# Patient Record
Sex: Female | Born: 2000 | Race: Black or African American | Hispanic: No | Marital: Single | Smoking: Never smoker
Health system: Southern US, Community
[De-identification: ages and names within clinical notes are randomized; demographics above are authoritative.]

## PROBLEM LIST (undated history)

## (undated) DIAGNOSIS — S060X9A Concussion with loss of consciousness of unspecified duration, initial encounter: Secondary | ICD-10-CM

## (undated) DIAGNOSIS — F410 Panic disorder [episodic paroxysmal anxiety] without agoraphobia: Secondary | ICD-10-CM

## (undated) DIAGNOSIS — T7840XA Allergy, unspecified, initial encounter: Secondary | ICD-10-CM

## (undated) DIAGNOSIS — G43109 Migraine with aura, not intractable, without status migrainosus: Secondary | ICD-10-CM

## (undated) HISTORY — PX: TONSILLECTOMY: SUR1361

## (undated) HISTORY — DX: Allergy, unspecified, initial encounter: T78.40XA

## (undated) HISTORY — DX: Concussion with loss of consciousness of unspecified duration, initial encounter: S06.0X9A

---
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
## 2019-05-07 ENCOUNTER — Encounter: Payer: Medicaid Other | Admitting: Family Medicine

## 2019-05-09 ENCOUNTER — Ambulatory Visit (INDEPENDENT_AMBULATORY_CARE_PROVIDER_SITE_OTHER): Payer: Medicaid Other | Admitting: Family Medicine

## 2019-05-09 ENCOUNTER — Encounter: Payer: Self-pay | Admitting: Family Medicine

## 2019-05-09 ENCOUNTER — Other Ambulatory Visit: Payer: Self-pay

## 2019-05-09 VITALS — BP 108/60 | HR 82 | Wt 265.8 lb

## 2019-05-09 DIAGNOSIS — Z00129 Encounter for routine child health examination without abnormal findings: Secondary | ICD-10-CM

## 2019-05-09 DIAGNOSIS — Z30011 Encounter for initial prescription of contraceptive pills: Secondary | ICD-10-CM

## 2019-05-09 LAB — POCT URINE PREGNANCY: Preg Test, Ur: NEGATIVE

## 2019-05-09 MED ORDER — LEVONORGEST-ETH ESTRAD 91-DAY 0.15-0.03 MG PO TABS: 1.0000 | ORAL_TABLET | Freq: Every day | ORAL | 4 refills | Status: DC
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
## 2019-05-09 NOTE — Patient Instructions (Addendum)
It was nice meeting you today Kathleen Thornton!  If you have any questions or concerns, please feel free to call the clinic.   Be well,  Dr. Shan Levans

## 2019-05-10 ENCOUNTER — Encounter: Payer: Self-pay | Admitting: Family Medicine

## 2019-05-10 DIAGNOSIS — Z30011 Encounter for initial prescription of contraceptive pills: Secondary | ICD-10-CM | POA: Insufficient documentation

## 2019-05-10 NOTE — Assessment & Plan Note (Signed)
Urine pregnancy test is negative today.  Patient was counseled on the proper use of oral contraceptives, which means that she takes them at around the same time every day.  She was told that if she skips even 2 pills in a row, she could become pregnant.  She was counseled that having a period is not biologically necessary when on contraception.  Patient wished to proceed with oral contraception and would like to have fewer periods, so generic Setlakin was prescribed.  She was encouraged to call if she has any issues tolerating or obtaining this medication.

## 2019-05-10 NOTE — Assessment & Plan Note (Addendum)
Patient is up-to-date on vaccinations.  HIV testing was offered to patient, but she declined this.  Healthy eating and exercise was discussed with the patient.  She was also counseled on using condoms to prevent STIs.  She was congratulated on starting college and her plans to become a Marine scientist.

## 2019-05-17 ENCOUNTER — Telehealth: Payer: Self-pay

## 2019-05-17 NOTE — Telephone Encounter (Signed)
Patient calls nurse line stating she picked up her birth control pills, however there was only one pill. I asked patient if she meant 1 pill pack? Patient stated, "no just one pill like plan B." I called pharmacy to see what the patient picked up. The pharmacist stated upon review that it was their error and they dispenses only one tablet to her. The pharmacist stated she will call the patient to explain and clarify would has happened. The pharmacist will dispense the correct pill pack to patient.

## 2019-07-04 ENCOUNTER — Other Ambulatory Visit: Payer: Self-pay

## 2019-07-04 ENCOUNTER — Ambulatory Visit (INDEPENDENT_AMBULATORY_CARE_PROVIDER_SITE_OTHER): Payer: Medicaid Other | Admitting: Family Medicine

## 2019-07-04 VITALS — BP 110/70 | HR 74 | Wt 272.8 lb

## 2019-07-04 DIAGNOSIS — R300 Dysuria: Secondary | ICD-10-CM

## 2019-07-04 DIAGNOSIS — N3000 Acute cystitis without hematuria: Secondary | ICD-10-CM

## 2019-07-04 LAB — POCT URINALYSIS DIP (MANUAL ENTRY)
Bilirubin, UA: NEGATIVE
Blood, UA: NEGATIVE
Glucose, UA: NEGATIVE mg/dL
Ketones, POC UA: NEGATIVE mg/dL
Leukocytes, UA: NEGATIVE
Nitrite, UA: NEGATIVE
Protein Ur, POC: NEGATIVE mg/dL
Spec Grav, UA: 1.025 (ref 1.010–1.025)
Urobilinogen, UA: 1 E.U./dL
pH, UA: 6.5 (ref 5.0–8.0)

## 2019-07-04 MED ORDER — CEPHALEXIN 500 MG PO CAPS: 500.0000 mg | ORAL_CAPSULE | Freq: Four times a day (QID) | ORAL | 0 refills | Status: AC

## 2019-07-04 NOTE — Progress Notes (Signed)
     Subjective:  HPI: Kathleen Thornton is a 18 y.o. presenting to clinic today to discuss the following:  Burning on Urination Patient reports dysuria that has been ongoing for one week. She also has associated frequency and urgency. She also endorses a little bit of blood after wiping after urination. She states she had this before and was treated for a UTI with antibiotics and got better. She denies fever, chills, flank pain, nausea, vomiting, abdomina pain, or constipation.  ROS noted in HPI.    Social History   Tobacco Use  Smoking Status Never Smoker  Smokeless Tobacco Never Used    Objective: BP 110/70   Pulse 74   Wt 272 lb 12.8 oz (123.7 kg)   SpO2 99%  Vitals and nursing notes reviewed  Physical Exam Gen: Alert and Oriented x 3, NAD CV: RRR, no murmurs, normal S1, S2 split Resp: CTAB, no wheezing, rales, or rhonchi, comfortable work of breathing Ext: no clubbing, cyanosis, or edema Skin: warm, dry, intact, no rashes  Results for orders placed or performed in visit on 07/04/19 (from the past 72 hour(s))  POCT urinalysis dipstick     Status: None   Collection Time: 07/04/19  3:45 PM  Result Value Ref Range   Color, UA yellow yellow   Clarity, UA clear clear   Glucose, UA negative negative mg/dL   Bilirubin, UA negative negative   Ketones, POC UA negative negative mg/dL   Spec Grav, UA 1.025 1.010 - 1.025   Blood, UA negative negative   pH, UA 6.5 5.0 - 8.0   Protein Ur, POC negative negative mg/dL   Urobilinogen, UA 1.0 0.2 or 1.0 E.U./dL   Nitrite, UA Negative Negative   Leukocytes, UA Negative Negative    Assessment/Plan:  UTI (urinary tract infection) Patient had negative UA and no blood on UA however given her constellation of symptoms I will treat for UTI. - Keflex 500mg  QID for 7 days - F/u if no improvement   PATIENT EDUCATION PROVIDED: See AVS    Diagnosis and plan along with any newly prescribed medication(s) were discussed in detail with  this patient today. The patient verbalized understanding and agreed with the plan. Patient advised if symptoms worsen return to clinic or ER.     Orders Placed This Encounter  Procedures  . POCT urinalysis dipstick    Meds ordered this encounter  Medications  . cephALEXin (KEFLEX) 500 MG capsule    Sig: Take 1 capsule (500 mg total) by mouth 4 (four) times daily for 7 days.    Dispense:  28 capsule    Refill:  0     Harolyn Rutherford, DO 07/04/2019, 3:55 PM PGY-3 [place]

## 2019-07-04 NOTE — Patient Instructions (Signed)
Acute Urinary Retention, Female  Acute urinary retention means that you cannot pee (urinate) at all, or that you pee too little and your bladder is not emptied completely. If it is not treated, it can lead to kidney damage or other serious problems. Follow these instructions at home:  Take over-the-counter and prescription medicines only as told by your doctor. Ask your doctor what medicines you should stay away from. Do not take any medicine unless your doctor says it is okay to do so.  If you were sent home with a tube that drains pee from the bladder (catheter), take care of it as told by your doctor.  Drink enough fluid to keep your pee clear or pale yellow.  If you were given an antibiotic, take it as told by your doctor. Do not stop taking the antibiotic even if you start to feel better.  Do not use any products that contain nicotine or tobacco, such as cigarettes and e-cigarettes. If you need help quitting, ask your doctor.  Watch for changes in your symptoms. Tell your doctor about them.  If told, keep track of any changes in your blood pressure at home. Tell your doctor about them.  Keep all follow-up visits as told by your doctor. This is important. Contact a doctor if:  You have spasms or you leak pee when you have spasms. Get help right away if:  You have chills or a fever.  You have blood in your pee.  You have a tube that drains the bladder and: ? The tube stops draining pee. ? The tube falls out. Summary  Acute urinary retention means that you cannot pee at all, or that you pee too little and your bladder is not emptied completely. If it is not treated, it can result in kidney damage or other serious problems.  If you were sent home with a tube that drains pee from the bladder, take care of it as told by your doctor.  Pay attention to any changes in your symptoms. Tell your doctor about them. This information is not intended to replace advice given to you by your  health care provider. Make sure you discuss any questions you have with your health care provider. Document Released: 03/01/2008 Document Revised: 08/26/2017 Document Reviewed: 10/15/2016 Elsevier Patient Education  2020 Elsevier Inc.  

## 2019-07-09 ENCOUNTER — Encounter: Payer: Self-pay | Admitting: Family Medicine

## 2019-07-09 DIAGNOSIS — N390 Urinary tract infection, site not specified: Secondary | ICD-10-CM | POA: Insufficient documentation

## 2019-07-09 NOTE — Assessment & Plan Note (Signed)
Patient had negative UA and no blood on UA however given her constellation of symptoms I will treat for UTI. - Keflex 500mg  QID for 7 days - F/u if no improvement

## 2019-07-20 ENCOUNTER — Other Ambulatory Visit: Payer: Self-pay

## 2019-07-20 DIAGNOSIS — Z20822 Contact with and (suspected) exposure to covid-19: Secondary | ICD-10-CM

## 2019-07-21 LAB — NOVEL CORONAVIRUS, NAA: SARS-CoV-2, NAA: NOT DETECTED

## 2019-07-23 ENCOUNTER — Telehealth: Payer: Self-pay | Admitting: General Practice

## 2019-07-23 NOTE — Telephone Encounter (Signed)
Patient informed of negative covid result. Patient verbalized understanding.   

## 2019-07-25 ENCOUNTER — Ambulatory Visit: Payer: Medicaid Other | Admitting: Family Medicine

## 2019-08-07 ENCOUNTER — Other Ambulatory Visit: Payer: Self-pay

## 2019-08-07 DIAGNOSIS — Z20822 Contact with and (suspected) exposure to covid-19: Secondary | ICD-10-CM

## 2019-08-09 LAB — NOVEL CORONAVIRUS, NAA: SARS-CoV-2, NAA: NOT DETECTED

## 2019-08-27 ENCOUNTER — Other Ambulatory Visit: Payer: Self-pay

## 2019-08-28 MED ORDER — LEVONORGEST-ETH ESTRAD 91-DAY 0.15-0.03 MG PO TABS: 1.0000 | ORAL_TABLET | Freq: Every day | ORAL | 4 refills | Status: AC

## 2019-12-26 IMAGING — DX DG CHEST 2V
2 series · 2 of 2 positions shown · non-contrast
Comparison: Radiographs October 12, 2016.

CLINICAL DATA: Chest pain.

EXAM:
CHEST  2 VIEW

[chest pa]
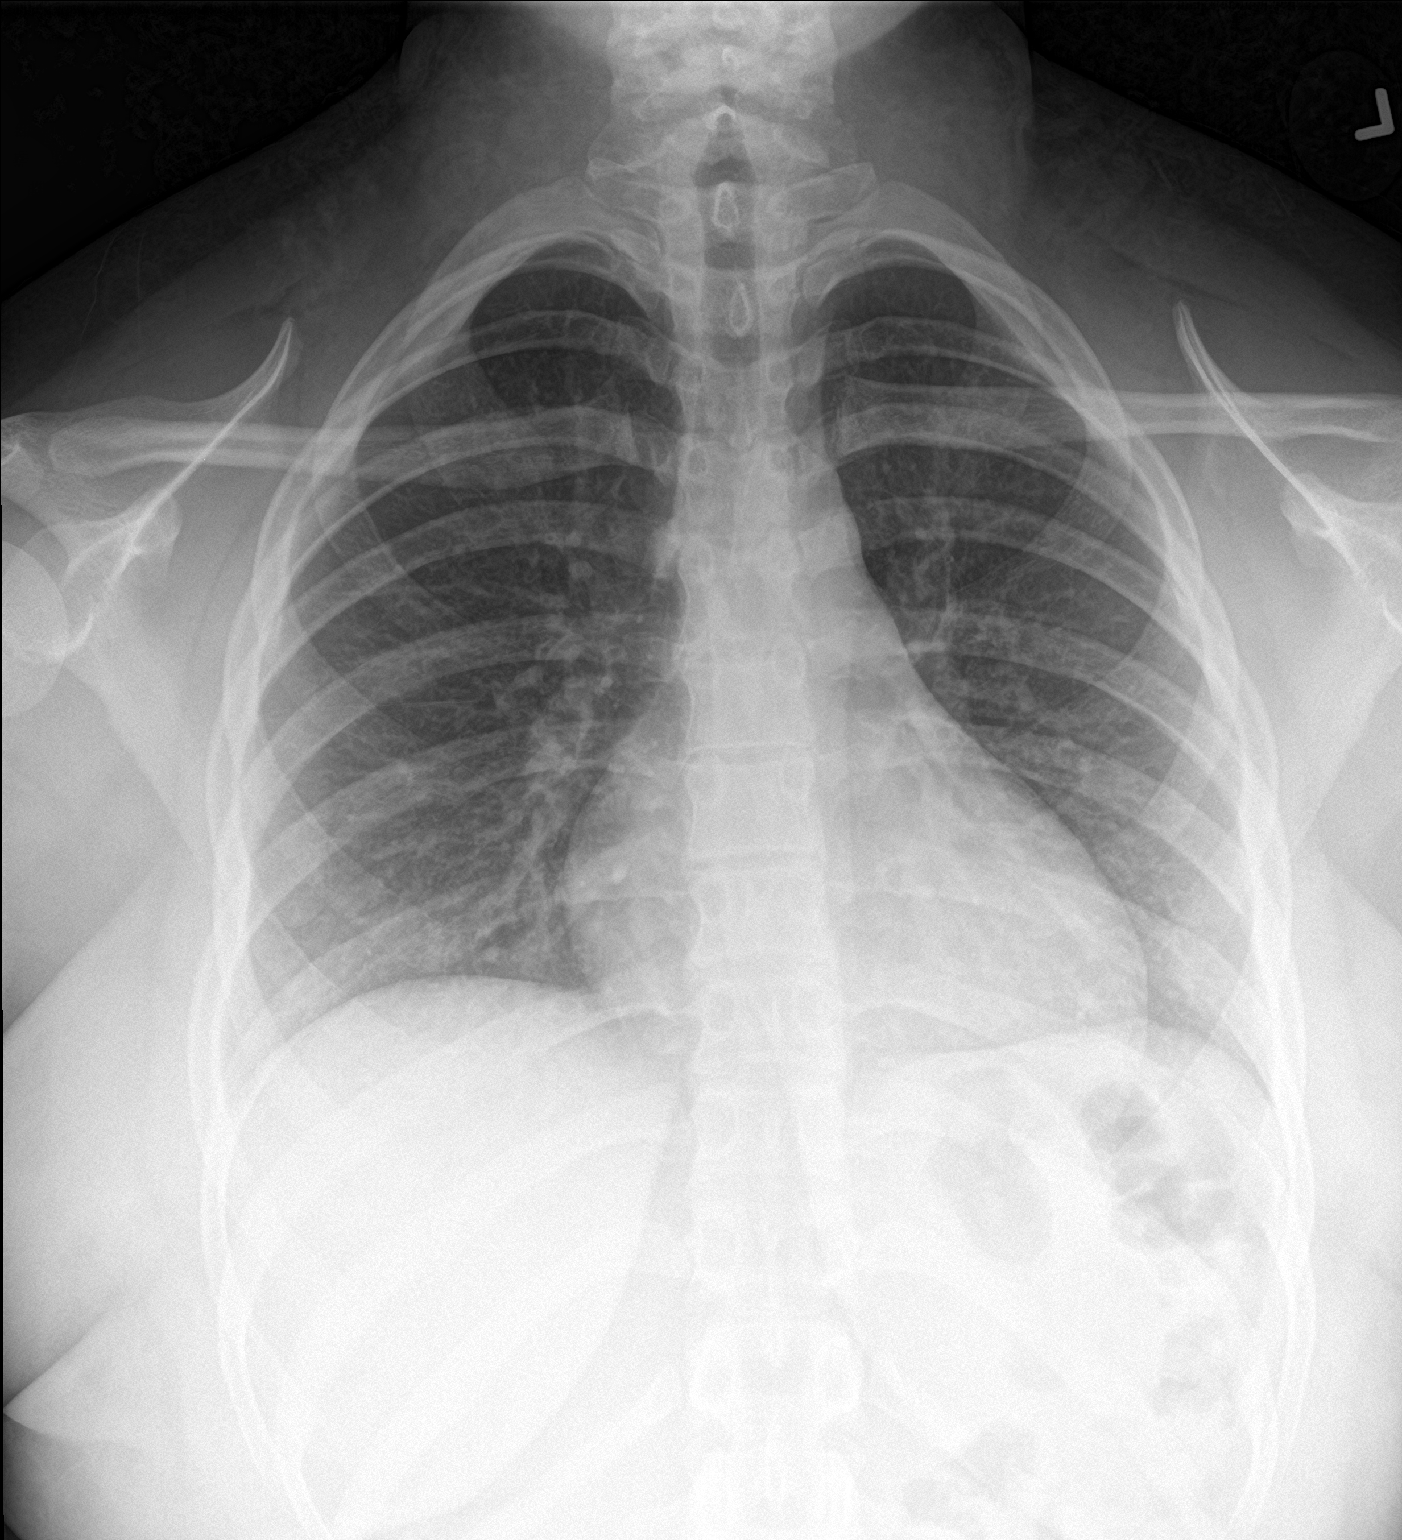

[chest lat]
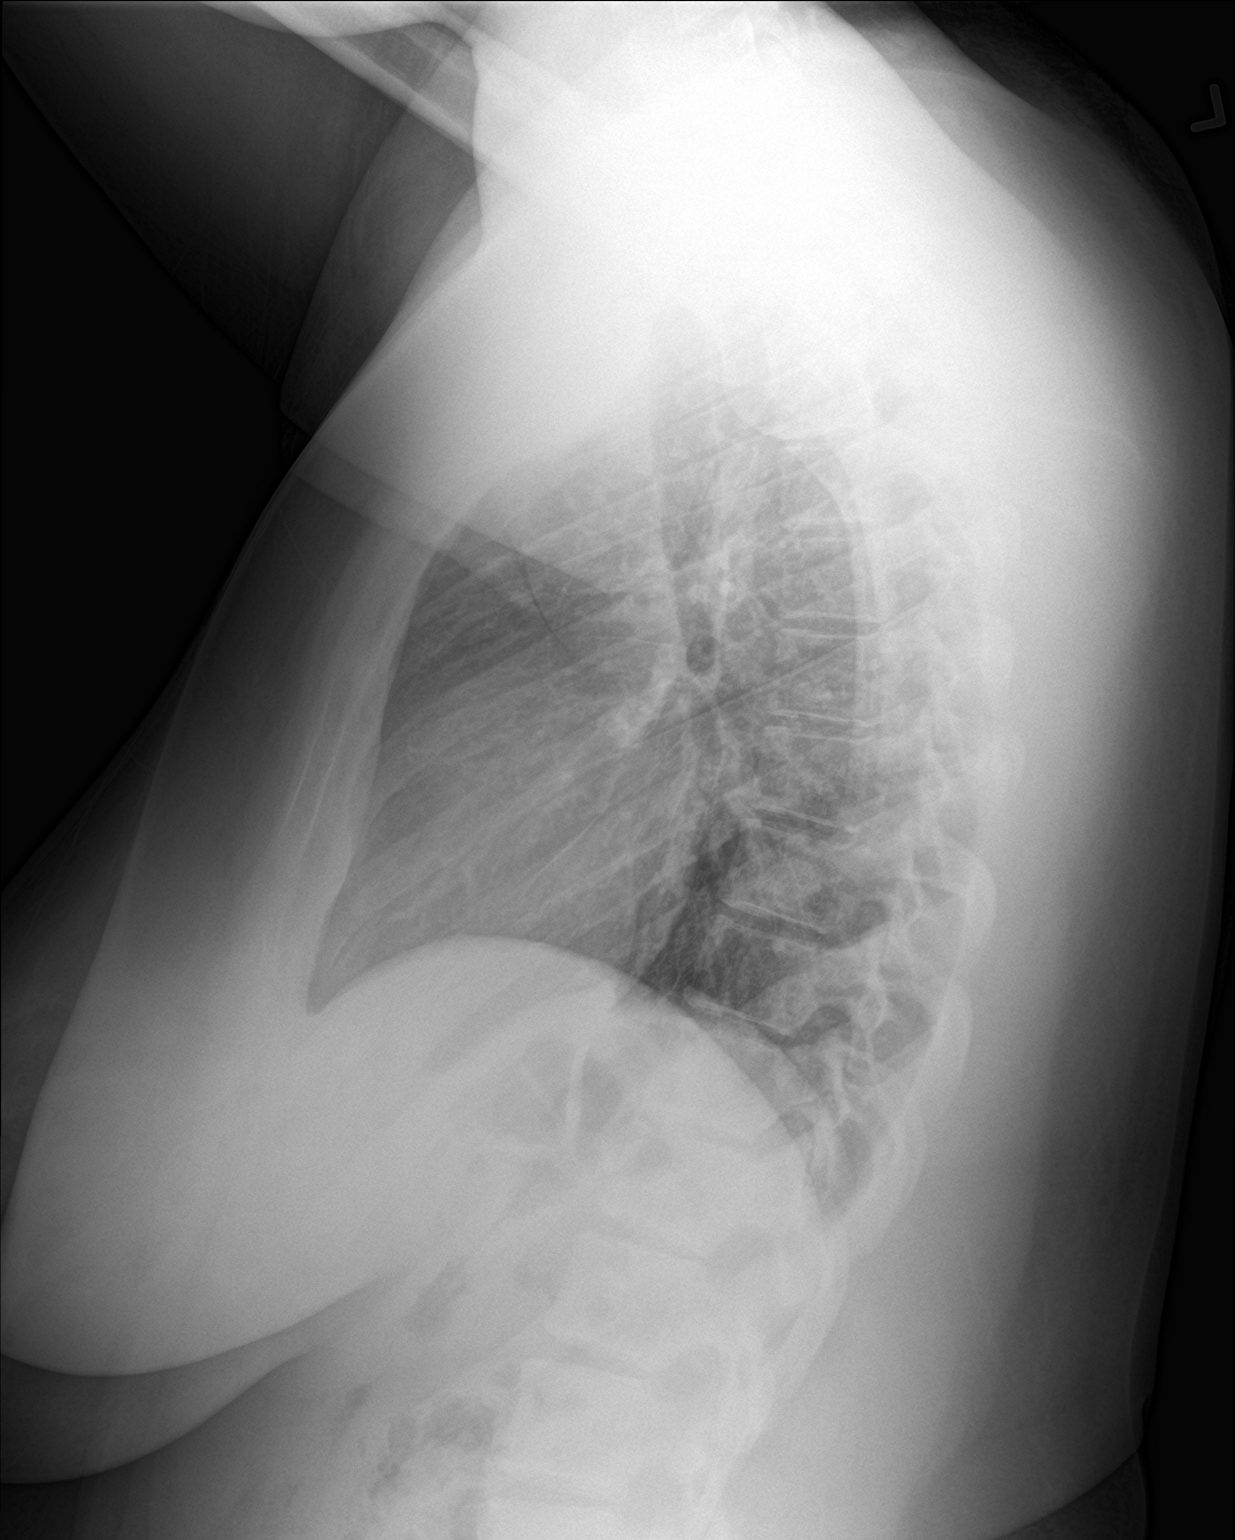

[2 of 2 positions shown; findings below may reference images not displayed]

FINDINGS: The heart size and mediastinal contours are within normal limits.
Both lungs are clear. No pneumothorax or pleural effusion is noted.
The visualized skeletal structures are unremarkable.
IMPRESSION: No active cardiopulmonary disease.

## 2020-02-07 ENCOUNTER — Ambulatory Visit (INDEPENDENT_AMBULATORY_CARE_PROVIDER_SITE_OTHER): Payer: Medicaid Other | Admitting: Family Medicine

## 2020-02-07 ENCOUNTER — Other Ambulatory Visit: Payer: Self-pay

## 2020-02-07 VITALS — BP 122/64 | HR 82 | Ht 66.0 in | Wt 288.4 lb

## 2020-02-07 DIAGNOSIS — R4589 Other symptoms and signs involving emotional state: Secondary | ICD-10-CM | POA: Insufficient documentation

## 2020-02-07 MED ORDER — SERTRALINE HCL 25 MG PO TABS: 25.0000 mg | ORAL_TABLET | Freq: Every day | ORAL | 1 refills | Status: AC

## 2020-02-07 NOTE — Progress Notes (Signed)
    SUBJECTIVE:   CHIEF COMPLAINT / HPI:   Mood Concerns  Few months has been getting annoyed easily, grouchy, feeling tired, has been wanting to overeat In the last few months has been living with boyfriend and his family which is stressful Taking time off school because it got too stressful Feels like she is sleeping too much She is working as a Ambulance person She used to work out but she has been too tired and just wants to sleep Her boyfriend has noticed that all she wants to do is sleep Denies SI     Office Visit from 02/07/2020 in [facility]  PHQ-9 Total Score  14      PERTINENT  PMH / PSH: None  OBJECTIVE:   BP 122/64   Pulse 82   Ht 5\' 6"  (1.676 m)   Wt 288 lb 6.4 oz (130.8 kg)   LMP 01/23/2020 (Approximate)   SpO2 96%   BMI 46.55 kg/m   Physical Exam:  General: 19 y.o. female in NAD Lungs: Breathing comfortably on RA Skin: warm and dry   ASSESSMENT/PLAN:   Depressed mood No SI.  Suspect Depression contributing to patient's symptoms with overall stress.  Discussed treatment options with patient including therapy and medication.  Patient has previously tried therapy and stated that she felt like she did not want to talk with the therapist because she thought she was unloading her stress on her.  States that she would be open to trying medication.  We will start her at a very small dose, Zoloft 25 mg daily.  Advised patient that this will take 6 weeks to take maximal effect.  Can consider increasing in 2 to 3 weeks when she follows up as long as she tolerates this well.  Also encourage patient to explore other therapy options as this may have just been a particular therapist that she did not jive with.  List of therapists given to her and advised her to call if she is interested in this.  Also given suicide hotline number and suicide precautions.  Return in 2 to 3 weeks.     12, DO [facility]

## 2020-02-07 NOTE — Patient Instructions (Addendum)
Thank you for coming to see me today. It was a pleasure. Today we talked about:   We will start a low dose of a medication for your mood.  Take it every day.  You will not see maximum effect until about 6 weeks.  Please follow-up with me or PCP in 2-3 weeks  If you have any questions or concerns, please do not hesitate to call the office at [phone].  Best,   [place] Constable, DO   Therapy and Counseling Resources Most providers on this list will take Medicaid. Patients with commercial insurance or Medicare should contact their insurance company to get a list of in network providers.  [facility]  [address] [place], [place] [address]      [place] [address]  [place], [place] [address] [phone]  [place] [address] [place]  [place],  [address]       [phone]      [facility]inny Blossom [facility] [address], [place], [place]  [facility]:  [place]. [place]  [facility]:  [place] [address] Loni Muse, [place]  [facility] (under & uninsured) [address], [place] [phone]    [email or web address]    [facility] [place]     Phone:  [phone]     [place]  [place] [address]      [place], [place] [address] [place]: [place], [place], [place]   [place] ([place] therapist) [address] [place] [address]   [place] [address]    [phone]    [facility]   [place],  [place], [place]   [place]  [place]  [facility]  [address] [place], [place]         [phone]  Open Access/Walk In Clinic under & uninsured [place], To schedule an appointment call [phone]- [phone] [address] [place] [phone]):  Fay Records from 8 AM - 3 PM Moving June 1 to [employer] at [facility] [address] [place],  ([place])   [place], [place]: [phone]) 8:30 - 12; 1 - 2:30  [facility] the [place],  [place], [place]    ([phone]):8:30 - 12; 2 - 3PM  [facility] [place],  [address],  [place]; [phone]):   Mon - Fri 8 AM - 5 PM  [facility] [place]  MWF 12:30 to 3:00 or call to schedule an appointment  [phone]  Specific Provider options Psychology Today  [email or web address].com/us 1. click on find a therapist  2. enter your zip code 3. left side and select or tailor a therapist for your specific need.   [facility] Provider Directory [email or web address]  (Medicaid)   Follow all drop down to find a provider  [place] or [email or web address] [address], [place], [place] Recovery support and educational   In home counseling [place] Telephone: [phone]  office  in [place] [email or web address]   Does not take reg. Medicaid or Medicare private insurance [facility], [place] health Choice, [facility], [place], [place], [place], [place] Health Choice  24- Hour Availability:  . [facility]   [phone] or [phone]  . [facility] of the [facility] [phone]  [facility] Crisis Service  [phone]   . [facility] [employer]  [phone] (after hours)  . Therapeutic Alternative/Mobile Crisis   [phone]  . [place] National Suicide Hotline  317-304-2955 (TALK)  . Call 911 or go to emergency room  . [employer]  [phone]);  [place] and [employer]   . [facility] ACCESS  [phone]); [place], [place], [place], [place], [place], [place], [place]

## 2020-02-07 NOTE — Assessment & Plan Note (Signed)
No SI.  Suspect Depression contributing to patient's symptoms with overall stress.  Discussed treatment options with patient including therapy and medication.  Patient has previously tried therapy and stated that she felt like she did not want to talk with the therapist because she thought she was unloading her stress on her.  States that she would be open to trying medication.  We will start her at a very small dose, Zoloft 25 mg daily.  Advised patient that this will take 6 weeks to take maximal effect.  Can consider increasing in 2 to 3 weeks when she follows up as long as she tolerates this well.  Also encourage patient to explore other therapy options as this may have just been a particular therapist that she did not jive with.  List of therapists given to her and advised her to call if she is interested in this.  Also given suicide hotline number and suicide precautions.  Return in 2 to 3 weeks.

## 2020-02-20 ENCOUNTER — Ambulatory Visit: Payer: Self-pay | Admitting: Family Medicine

## 2020-04-11 ENCOUNTER — Ambulatory Visit: Payer: Medicaid Other | Admitting: Family Medicine

## 2020-04-14 ENCOUNTER — Ambulatory Visit: Payer: Medicaid Other | Admitting: Family Medicine

## 2020-06-20 ENCOUNTER — Ambulatory Visit (INDEPENDENT_AMBULATORY_CARE_PROVIDER_SITE_OTHER): Payer: Medicaid Other | Admitting: Family Medicine

## 2020-06-20 DIAGNOSIS — Z5329 Procedure and treatment not carried out because of patient's decision for other reasons: Secondary | ICD-10-CM

## 2020-06-20 DIAGNOSIS — Z91199 Patient's noncompliance with other medical treatment and regimen due to unspecified reason: Secondary | ICD-10-CM | POA: Insufficient documentation

## 2020-06-20 NOTE — Progress Notes (Signed)
Patient no-show to today's visit. -Can reschedule as needed.  Peggyann Shoals, DO [facility], PGY-3 06/20/2020 4:03 PM

## 2020-07-02 DIAGNOSIS — M722 Plantar fascial fibromatosis: Secondary | ICD-10-CM | POA: Diagnosis not present

## 2022-03-02 ENCOUNTER — Encounter: Payer: Self-pay | Admitting: *Deleted

## 2022-12-20 DIAGNOSIS — B9689 Other specified bacterial agents as the cause of diseases classified elsewhere: Secondary | ICD-10-CM | POA: Diagnosis not present

## 2022-12-20 DIAGNOSIS — N760 Acute vaginitis: Secondary | ICD-10-CM | POA: Diagnosis not present

## 2023-08-12 DIAGNOSIS — Z7251 High risk heterosexual behavior: Secondary | ICD-10-CM | POA: Diagnosis not present

## 2023-08-12 DIAGNOSIS — Z114 Encounter for screening for human immunodeficiency virus [HIV]: Secondary | ICD-10-CM | POA: Diagnosis not present

## 2024-02-12 DIAGNOSIS — R102 Pelvic and perineal pain: Secondary | ICD-10-CM | POA: Diagnosis not present

## 2024-02-12 DIAGNOSIS — N898 Other specified noninflammatory disorders of vagina: Secondary | ICD-10-CM | POA: Diagnosis not present
